# Patient Record
Sex: Female | Born: 1960 | Race: White | Hispanic: No | Marital: Single | Smoking: Former smoker
Health system: Southern US, Community
[De-identification: ages and names within clinical notes are randomized; demographics above are authoritative.]

## PROBLEM LIST (undated history)

## (undated) DIAGNOSIS — F32A Depression, unspecified: Secondary | ICD-10-CM

## (undated) DIAGNOSIS — R413 Other amnesia: Secondary | ICD-10-CM

## (undated) DIAGNOSIS — E039 Hypothyroidism, unspecified: Secondary | ICD-10-CM

## (undated) DIAGNOSIS — R011 Cardiac murmur, unspecified: Secondary | ICD-10-CM

## (undated) DIAGNOSIS — R300 Dysuria: Secondary | ICD-10-CM

## (undated) DIAGNOSIS — R7989 Other specified abnormal findings of blood chemistry: Secondary | ICD-10-CM

## (undated) HISTORY — DX: Dysuria: R30.0

## (undated) HISTORY — DX: Hypothyroidism, unspecified: E03.9

## (undated) HISTORY — DX: Other specified abnormal findings of blood chemistry: R79.89

## (undated) HISTORY — DX: Depression, unspecified: F32.A

## (undated) HISTORY — PX: TUBAL LIGATION: SHX77

## (undated) HISTORY — DX: Other amnesia: R41.3

---

## 2012-08-21 ENCOUNTER — Other Ambulatory Visit (HOSPITAL_COMMUNITY): Payer: Self-pay | Admitting: Cardiovascular Disease

## 2012-08-21 DIAGNOSIS — R011 Cardiac murmur, unspecified: Secondary | ICD-10-CM

## 2012-08-27 ENCOUNTER — Ambulatory Visit (HOSPITAL_COMMUNITY)
Admission: RE | Admit: 2012-08-27 | Discharge: 2012-08-27 | Disposition: A | Discharge status: Home / Self Care | Payer: BC Managed Care – PPO | Source: Ambulatory Visit | Attending: Cardiovascular Disease | Admitting: Cardiovascular Disease

## 2012-08-27 DIAGNOSIS — I059 Rheumatic mitral valve disease, unspecified: Secondary | ICD-10-CM | POA: Insufficient documentation

## 2012-08-27 DIAGNOSIS — R011 Cardiac murmur, unspecified: Secondary | ICD-10-CM | POA: Insufficient documentation

## 2012-08-27 DIAGNOSIS — I079 Rheumatic tricuspid valve disease, unspecified: Secondary | ICD-10-CM | POA: Insufficient documentation

## 2012-08-27 NOTE — Progress Notes (Signed)
2D Echo Performed 08/27/2012    Lolitha Tortora, RCS  

## 2013-10-22 ENCOUNTER — Emergency Department (HOSPITAL_BASED_OUTPATIENT_CLINIC_OR_DEPARTMENT_OTHER)
Admission: EM | Admit: 2013-10-22 | Discharge: 2013-10-22 | Disposition: A | Discharge status: Home / Self Care | Payer: BC Managed Care – PPO | Attending: Emergency Medicine | Admitting: Emergency Medicine

## 2013-10-22 ENCOUNTER — Encounter (HOSPITAL_BASED_OUTPATIENT_CLINIC_OR_DEPARTMENT_OTHER): Payer: Self-pay | Admitting: Emergency Medicine

## 2013-10-22 DIAGNOSIS — R011 Cardiac murmur, unspecified: Secondary | ICD-10-CM | POA: Insufficient documentation

## 2013-10-22 DIAGNOSIS — Z79899 Other long term (current) drug therapy: Secondary | ICD-10-CM | POA: Insufficient documentation

## 2013-10-22 DIAGNOSIS — Z87891 Personal history of nicotine dependence: Secondary | ICD-10-CM | POA: Insufficient documentation

## 2013-10-22 DIAGNOSIS — IMO0002 Reserved for concepts with insufficient information to code with codable children: Secondary | ICD-10-CM | POA: Insufficient documentation

## 2013-10-22 DIAGNOSIS — R21 Rash and other nonspecific skin eruption: Secondary | ICD-10-CM | POA: Insufficient documentation

## 2013-10-22 DIAGNOSIS — Z880 Allergy status to penicillin: Secondary | ICD-10-CM | POA: Insufficient documentation

## 2013-10-22 HISTORY — DX: Cardiac murmur, unspecified: R01.1

## 2013-10-22 MED ORDER — DIPHENHYDRAMINE HCL 25 MG PO TABS: 25.0000 mg | ORAL_TABLET | Freq: Four times a day (QID) | ORAL | Status: DC

## 2013-10-22 MED ORDER — HYDROCORTISONE 1 % EX CREA: TOPICAL_CREAM | CUTANEOUS | Status: DC

## 2013-10-22 NOTE — Discharge Instructions (Signed)
Rash Use the benadryl and steroid cream as prescribed. Follow up with your doctor. Return to the ED if the rash spreads, you develop chest pain, shortness of breath, or any other concerning symptoms. A rash is a change in the color or texture of your skin. There are many different types of rashes. You may have other problems that accompany your rash. CAUSES   Infections.  Allergic reactions. This can include allergies to pets or foods.  Certain medicines.  Exposure to certain chemicals, soaps, or cosmetics.  Heat.  Exposure to poisonous plants.  Tumors, both cancerous and noncancerous. SYMPTOMS   Redness.  Scaly skin.  Itchy skin.  Dry or cracked skin.  Bumps.  Blisters.  Pain. DIAGNOSIS  Your caregiver may do a physical exam to determine what type of rash you have. A skin sample (biopsy) may be taken and examined under a microscope. TREATMENT  Treatment depends on the type of rash you have. Your caregiver may prescribe certain medicines. For serious conditions, you may need to see a skin doctor (dermatologist). HOME CARE INSTRUCTIONS   Avoid the substance that caused your rash.  Do not scratch your rash. This can cause infection.  You may take cool baths to help stop itching.  Only take over-the-counter or prescription medicines as directed by your caregiver.  Keep all follow-up appointments as directed by your caregiver. SEEK IMMEDIATE MEDICAL CARE IF:  You have increasing pain, swelling, or redness.  You have a fever.  You have new or severe symptoms.  You have body aches, diarrhea, or vomiting.  Your rash is not better after 3 days. MAKE SURE YOU:  Understand these instructions.  Will watch your condition.  Will get help right away if you are not doing well or get worse. Document Released: 03/30/2002 Document Revised: 07/02/2011 Document Reviewed: 01/22/2011 [facility] Patient Information 2015 [employer] [place]. This information is not intended to  replace advice given to you by your health care provider. Make sure you discuss any questions you have with your health care provider.

## 2013-10-22 NOTE — ED Notes (Signed)
Patient states she was at her work sitting in her desk developed itching on her right neck.  States the rash has continued to progress on the neck and jaw, red in color with mild swelling. No known injury, bite or new meds or creams.

## 2013-10-22 NOTE — ED Provider Notes (Signed)
CSN: [id number]     Arrival date & time    History  This chart was scribed for Glynn OctaveStephen Dee Maday, MD by Shari HeritageAisha Amuda, ED Scribe. The patient was seen in room MH09/MH09. Patient's care was started at 7:52 AM.   Chief Complaint  Patient presents with  . Rash    The history is provided by the patient. No language interpreter was used.    HPI Comments: Kendra Huff is a 53 y.o. female who presents to the Emergency Department complaining of gradually worsening, red, pruritic rash that began yesterday afternoon while patient was sitting at her desk. She states rash started on her right lateral neck, but has spread to her right lower face. Patient says that rash is not painful. She denies oral or genital involvement. She has not used any new detergents, soaps, medications or shampoos. Patient states that she was stung by a hornet 1 week ago. She denies shortness of breath, congestion, rhinorrhea, chest pain, dysphagia or any other symptoms at this time. She has no chronic medical conditions and does not take any medications on a daily basis.    Past Medical History  Diagnosis Date  . Murmur    History reviewed. No pertinent past surgical history. No family history on file. History  Substance Use Topics  . Smoking status: Former Games developermoker  . Smokeless tobacco: Never Used  . Alcohol Use: Not on file     Comment: occassional   OB History   Grav Para Term Preterm Abortions TAB SAB Ect Mult Living                 Review of Systems A complete 10 system review of systems was obtained and all systems are negative except as noted in the HPI and PMH.   Allergies  Penicillins  Home Medications   Prior to Admission medications   Medication Sig Start Date End Date Taking? Authorizing Provider  citalopram (CELEXA) 20 MG tablet Take 20 mg by mouth daily.   Yes Historical Provider, MD  diphenhydrAMINE (BENADRYL) 25 MG tablet Take 1 tablet (25 mg total) by mouth every 6 (six) hours. 10/22/13    Glynn OctaveStephen Tanuj Mullens, MD  hydrocortisone cream 1 % Apply to affected area 2 times daily 10/22/13   Glynn OctaveStephen Dema Timmons, MD   Triage Vitals: BP 128/78  Pulse 64  Temp(Src) 97.7 F (36.5 C) (Oral)  Resp 16  Ht 5\' 4"  (1.626 m)  Wt 165 lb (74.844 kg)  BMI 28.31 kg/m2  SpO2 99%  Physical Exam  Nursing note and vitals reviewed. Constitutional: She is oriented to person, place, and time. She appears well-developed and well-nourished. No distress.  HENT:  Head: Normocephalic and atraumatic.  Mouth/Throat: Oropharynx is clear and moist. No oropharyngeal exudate.  Oropharynx clear, no asymmetry, controlling secretions, floor of mouth soft.   Eyes: Conjunctivae and EOM are normal. Pupils are equal, round, and reactive to light.  Neck: Normal range of motion. Neck supple.  No meningismus.  Cardiovascular: Normal rate, regular rhythm, normal heart sounds and intact distal pulses.   No murmur heard. Pulmonary/Chest: Effort normal and breath sounds normal. No respiratory distress. She has no wheezes. She has no rales.  Abdominal: Soft. There is no tenderness. There is no rebound and no guarding.  Musculoskeletal: Normal range of motion. She exhibits no edema and no tenderness.  Neurological: She is alert and oriented to person, place, and time. No cranial nerve deficit. She exhibits normal muscle tone. Coordination normal.  No ataxia on finger  to nose bilaterally. No pronator drift. 5/5 strength throughout. CN 2-12 intact. Negative Romberg. Equal grip strength. Sensation intact. Gait is normal.   Skin: Skin is warm. Rash noted. There is erythema.  Erythematous rash to right lateral neck and right angle of mandible. No appreciable swelling.  Psychiatric: She has a normal mood and affect. Her behavior is normal.    ED Course  Procedures (including critical care time) DIAGNOSTIC STUDIES: Oxygen Saturation is 99% on room air, normal by my interpretation.    COORDINATION OF CARE: 7:57 AM- Patient informed  of current plan for treatment and evaluation and agrees with plan at this time.      MDM   Final diagnoses:  Rash   Red itchy rash to right side of neck spreading to right jaw since yesterday. No difficulty breathing or swallowing. Denies any new exposures. No chest pain or shortness of breath  OP clear, lungs clear, no distress.  Suspect allergic exposure.  Treat with antihistamines, steroid cream. Follow up with PCP.  Return to the ED with spreading rash, difficulty breathing or swallowing.   I personally performed the services described in this documentation, which was scribed in my presence. The recorded information has been reviewed and is accurate.   Glynn OctaveStephen Jeffree Cazeau, MD 10/22/13 1302

## 2014-04-26 ENCOUNTER — Ambulatory Visit: Payer: BC Managed Care – PPO | Admitting: Cardiovascular Disease

## 2014-05-03 ENCOUNTER — Ambulatory Visit (INDEPENDENT_AMBULATORY_CARE_PROVIDER_SITE_OTHER): Payer: BLUE CROSS/BLUE SHIELD | Admitting: Cardiovascular Disease

## 2014-05-03 ENCOUNTER — Encounter: Payer: Self-pay | Admitting: Cardiovascular Disease

## 2014-05-03 VITALS — BP 108/60 | HR 52 | Resp 16 | Ht 65.0 in | Wt 151.8 lb

## 2014-05-03 DIAGNOSIS — R001 Bradycardia, unspecified: Secondary | ICD-10-CM

## 2014-05-03 DIAGNOSIS — Z79899 Other long term (current) drug therapy: Secondary | ICD-10-CM

## 2014-05-03 DIAGNOSIS — E7800 Pure hypercholesterolemia, unspecified: Secondary | ICD-10-CM | POA: Insufficient documentation

## 2014-05-03 DIAGNOSIS — E785 Hyperlipidemia, unspecified: Secondary | ICD-10-CM

## 2014-05-03 DIAGNOSIS — R002 Palpitations: Secondary | ICD-10-CM

## 2014-05-03 LAB — LIPID PANEL
Cholesterol: 261 mg/dL — ABNORMAL HIGH (ref 0–200)
HDL: 59 mg/dL (ref >39)
LDL CALC: 184 mg/dL — AB (ref 0–99)
Total CHOL/HDL Ratio: 4.4 Ratio
Triglycerides: 90 mg/dL (ref <150)
VLDL: 18 mg/dL (ref 0–40)

## 2014-05-03 NOTE — Patient Instructions (Signed)
Your physician recommends that you return for lab work in: TODAY  Dr. Royann Shiversroitoru recommends that you schedule a follow-up appointment in: Yearly

## 2014-05-03 NOTE — Progress Notes (Signed)
Patient ID: Kendra Huff, female   DOB: [birth year], 54 y.o.   MRN: [id number]      Reason for office visit Establish new care  This is Kendra Huff first evaluation since Dr. Kandis Cocking retirement. She last saw him in April 2014. At that time her name was Kendra Huff and her medical record number was [id number]. Her father, Kendra Huff, is also our patient. Her mother Kendra Huff died last year. Both her parents had a variety of cardiac problems.  Kendra Huff has been paying a lot of attention to diet and exercise and has lost almost 27 pounds since her last appointment. She performs plyometric yoga. She denies problems with chest pain or shortness of breath during exercise. She has always had a tendency towards bradycardia with heart rates that are often around 50 bpm. She denies problems with syncope, fatigue or dizziness. In the past she has been occasionally troubled by palpitations which are currently not a big bother. Her lipid profile has shown a tendency towards hypercholesterolemia and elevated LDL cholesterol and a very high lipoprotein LP(a). A [facility] heart lab profile performed in 2011 showed total cholesterol 200, LDL 129, HDL 59, transferred 61, LP little a 64 April be 93 and an adverse April E genotype (3/4).  An echocardiogram performed last May showed normal findings. Left ventricular ejection fraction is 55/60%. Diastolic function was also normal. There was mild mitral valve regurgitation but no evidence of mitral valve prolapse. A treadmill nuclear stress test performed in 2010 was normal. She was able to exercise for total of 10 minutes and 30 seconds and had a normal study both by ECG and perfusion. An event monitor in 2012 showed rare PVCs and sinus bradycardia.  Her father Kendra Huff has nonischemic cardiomyopathy, CHF and has received a biventricular ICD. He has had persistent atrial fibrillation. Her mother also had atrial fibrillation.   Allergies  Allergen  Reactions  . Penicillins     Current Outpatient Prescriptions  Medication Sig Dispense Refill  . citalopram (CELEXA) 20 MG tablet Take 20 mg by mouth daily.     No current facility-administered medications for this visit.    Past Medical History  Diagnosis Date  . Murmur     History reviewed. No pertinent past surgical history.  Family History  Problem Relation Age of Onset  . Atrial fibrillation Mother   . Heart failure Father   . Atrial fibrillation Father     History   Social History  . Marital Status: Legally Separated    Spouse Name: N/A    Number of Children: N/A  . Years of Education: N/A   Occupational History  . Not on file.   Social History Main Topics  . Smoking status: Former Games developer  . Smokeless tobacco: Never Used  . Alcohol Use: Not on file     Comment: occassional  . Drug Use: No  . Sexual Activity: Not on file   Other Topics Concern  . Not on file   Social History Narrative    Review of systems: The patient specifically denies any chest pain at rest or with exertion, dyspnea at rest or with exertion, orthopnea, paroxysmal nocturnal dyspnea, syncope, palpitations, focal neurological deficits, intermittent claudication, lower extremity edema, unexplained weight gain, cough, hemoptysis or wheezing.  The patient also denies abdominal pain, nausea, vomiting, dysphagia, diarrhea, constipation, polyuria, polydipsia, dysuria, hematuria, frequency, urgency, abnormal bleeding or bruising, fever, chills, unexpected weight changes, mood swings, change in skin or hair texture, change in voice quality,  auditory or visual problems, allergic reactions or rashes, new musculoskeletal complaints other than usual "aches and pains".   PHYSICAL EXAM BP 108/60 mmHg  Pulse 52  Resp 16  Ht 5\' 5"  (1.651 m)  Wt 151 lb 12.8 oz (68.856 kg)  BMI 25.26 kg/m2  General: Alert, oriented x3, no distress Head: no evidence of trauma, PERRL, EOMI, no exophtalmos or lid lag,  no myxedema, no xanthelasma; normal ears, nose and oropharynx Neck: normal jugular venous pulsations and no hepatojugular reflux; brisk carotid pulses without delay and no carotid bruits Chest: clear to auscultation, no signs of consolidation by percussion or palpation, normal fremitus, symmetrical and full respiratory excursions Cardiovascular: normal position and quality of the apical impulse, regular rhythm, normal first and second heart sounds, 1/6 holosystolic left lower sternal border murmus, no rubs or gallops Abdomen: no tenderness or distention, no masses by palpation, no abnormal pulsatility or arterial bruits, normal bowel sounds, no hepatosplenomegaly Extremities: no clubbing, cyanosis or edema; 2+ radial, ulnar and brachial pulses bilaterally; 2+ right femoral, posterior tibial and dorsalis pedis pulses; 2+ left femoral, posterior tibial and dorsalis pedis pulses; no subclavian or femoral bruits Neurological: grossly nonfocal   EKG: Sinus bradycardia otherwise normal  Lipid Panel  No results found for: CHOL, TRIG, HDL, CHOLHDL, VLDL, LDLCALC, LDLDIRECT  BMET No results found for: NA, K, CL, CO2, GLUCOSE, BUN, CREATININE, CALCIUM, GFRNONAA, GFRAA   ASSESSMENT AND PLAN  Kendra Huff has mild abnormalities in her lipid profile that likely have improved substantially with her weight loss. Will repeat a lipid profile today. Past palpitations seem to be less of a bother today. Empirical treatment with beta blockers for her PVCs is unlikely to be well tolerated due to underlying sinus bradycardia. On the other hand the bradycardia is asymptomatic and does not require treatment. Both her parents had atrial fibrillation at advanced ages and her father had nonischemic cardiomyopathy. It is not clear whether this genetic background will have any significant impact on her own health. She has an "innocent" murmur, her echocardiogram only showed mild mitral insufficiency  Orders Placed This  Encounter  Procedures  . Lipid panel  . EKG 12-Lead   No orders of the defined types were placed in this encounter.    Junious SilkROITORU,Yoav Okane  Feiga Nadel, MD, [facility] [phone] office [phone] pager

## 2014-06-10 ENCOUNTER — Telehealth: Payer: Self-pay | Admitting: Cardiovascular Disease

## 2014-06-10 NOTE — Telephone Encounter (Signed)
Please call,question about her cholesterol level.

## 2014-06-10 NOTE — Telephone Encounter (Signed)
Results reported, put copy of labs in mail for patient.

## 2015-05-12 ENCOUNTER — Ambulatory Visit: Payer: BLUE CROSS/BLUE SHIELD | Admitting: Cardiovascular Disease

## 2015-06-15 ENCOUNTER — Encounter: Payer: Self-pay | Admitting: Cardiovascular Disease

## 2015-06-15 ENCOUNTER — Ambulatory Visit (INDEPENDENT_AMBULATORY_CARE_PROVIDER_SITE_OTHER): Payer: BLUE CROSS/BLUE SHIELD | Admitting: Cardiovascular Disease

## 2015-06-15 VITALS — BP 116/66 | HR 52 | Ht 65.0 in | Wt 153.0 lb

## 2015-06-15 DIAGNOSIS — R001 Bradycardia, unspecified: Secondary | ICD-10-CM | POA: Diagnosis not present

## 2015-06-15 DIAGNOSIS — R002 Palpitations: Secondary | ICD-10-CM | POA: Diagnosis not present

## 2015-06-15 NOTE — Patient Instructions (Signed)
Your physician recommends that you schedule a follow-up appointment as needed with Dr. Royann Shivers.

## 2015-06-17 NOTE — Progress Notes (Signed)
Patient ID: Kendra Huff, female   DOB: 08/05/1960, 55 y.o.   MRN: [id number]    Cardiology Office Note    Date:  06/17/2015   ID:  Kendra Huff, DOB [birth year], MRN [id number]  PCP:  Kendra Bonito, MD  Cardiologist:   Kendra Fair, MD   Chief Complaint  Patient presents with  . Annual Exam    Patient has no complaints.    History of Present Illness:  Kendra Huff is a 55 y.o. female with a history of palpitations presenting for follow-up. She feels great. She continues to exercise avidly. She performs "boot camp" at the gym 3 days a week and then lifts weights at home. She denies any problems with dyspnea, angina, palpitations, syncope or dizziness, leg edema or other cardiovascular complaints.  Abnormal findings on echocardiogram and stress test in the past. A remote event monitor in 2012 suggested that her palpitations were related to PVCs.  Her father Kendra Huff is also my patient, he has nonischemic cardiomyopathy and a biventricular defibrillator. Both Kendra Huff have atrial fibrillation.  Past Medical History  Diagnosis Date  . Murmur     History reviewed. No pertinent past surgical history.  Outpatient Prescriptions Prior to Visit  Medication Sig Dispense Refill  . citalopram (CELEXA) 20 MG tablet Take 20 mg by mouth daily.     No facility-administered medications prior to visit.     Allergies:   Penicillins   Social History   Social History  . Marital Status: Legally Separated    Spouse Name: N/A  . Number of Children: N/A  . Years of Education: N/A   Social History Main Topics  . Smoking status: Former Games developer  . Smokeless tobacco: Never Used  . Alcohol Use: None     Comment: occassional  . Drug Use: No  . Sexual Activity: Not Asked   Other Topics Concern  . None   Social History Narrative     Family History:  The patient's family history includes Atrial fibrillation in her father and mother; Heart failure in her  father.   ROS:   Please see the history of present illness.    ROS All other systems reviewed and are negative.   PHYSICAL EXAM:   VS:  BP 116/66 mmHg  Pulse 52  Ht  (1.651 m)  Wt 69.4 kg (153 lb)  BMI 25.46 kg/m2   GEN: Well nourished, well developed, in no acute distress HEENT: normal Neck: no JVD, carotid bruits, or masses Cardiac: RRR; no murmurs, rubs, or gallops,no edema  Respiratory:  clear to auscultation bilaterally, normal work of breathing GI: soft, nontender, nondistended, + BS MS: no deformity or atrophy Skin: warm and dry, no rash Neuro:  Alert and Oriented x 3, Strength and sensation are intact Psych: euthymic mood, full affect  Wt Readings from Last 3 Encounters:  06/15/15 69.4 kg (153 lb)  05/03/14 68.856 kg (151 lb 12.8 oz)  10/22/13 74.844 kg (165 lb)      Studies/Labs Reviewed:   EKG:  EKG is ordered today.  The ekg ordered today demonstrates sinus bradycardia, otherwise normal. QTC 412 ms  Recent Labs: No results found for requested labs within last 365 days.   Lipid Panel    Component Value Date/Time   CHOL 261* 05/03/2014 0836   TRIG 90 05/03/2014 0836   HDL 59 05/03/2014 0836   CHOLHDL 4.4 05/03/2014 0836   VLDL 18 05/03/2014 0836   LDLCALC 184* 05/03/2014 7829  ASSESSMENT:    1. Sinus bradycardia   2. Palpitations      PLAN:  In order of problems listed above:  1. I think this is a sign of physical fitness. It is asymptomatic and does not require treatment 2. Palpitations appear to be related to PVCs. They're currently not bothering her. No treatment recommended  Kendra Huff's evaluation was primarily prompted by the presence of atrial fibrillation in both her Huff. She is physically active and fit and does not have any evidence of structural heart disease or other risk factors for development of cardiac illness, other than family history. At this point routine cardiology follow-up does not appear to be indicated  but we will be more than happy to see her again if she develops palpitations or other cardiac complaints.  Medication Adjustments/Labs and Tests Ordered: Current medicines are reviewed at length with the patient today.  Concerns regarding medicines are outlined above.  Medication changes, Labs and Tests ordered today are listed in the Patient Instructions below. Patient Instructions  Your physician recommends that you schedule a follow-up appointment as needed with Dr. Alonzo Huff.       Kendra BRoyann Shivers, MD  06/17/2015 4:36 PM    [facility] [address] [place], [place], [place]  [address] Phone: [phone]; Fax: [phone]

## 2015-07-23 ENCOUNTER — Emergency Department (HOSPITAL_BASED_OUTPATIENT_CLINIC_OR_DEPARTMENT_OTHER)
Admission: EM | Admit: 2015-07-23 | Discharge: 2015-07-23 | Disposition: A | Discharge status: Home / Self Care | Payer: BLUE CROSS/BLUE SHIELD | Attending: Emergency Medicine | Admitting: Emergency Medicine

## 2015-07-23 ENCOUNTER — Emergency Department (HOSPITAL_BASED_OUTPATIENT_CLINIC_OR_DEPARTMENT_OTHER): Payer: BLUE CROSS/BLUE SHIELD

## 2015-07-23 ENCOUNTER — Encounter (HOSPITAL_BASED_OUTPATIENT_CLINIC_OR_DEPARTMENT_OTHER): Payer: Self-pay | Admitting: *Deleted

## 2015-07-23 DIAGNOSIS — W010XXA Fall on same level from slipping, tripping and stumbling without subsequent striking against object, initial encounter: Secondary | ICD-10-CM | POA: Insufficient documentation

## 2015-07-23 DIAGNOSIS — R011 Cardiac murmur, unspecified: Secondary | ICD-10-CM | POA: Insufficient documentation

## 2015-07-23 DIAGNOSIS — Y9289 Other specified places as the place of occurrence of the external cause: Secondary | ICD-10-CM | POA: Diagnosis not present

## 2015-07-23 DIAGNOSIS — Z87891 Personal history of nicotine dependence: Secondary | ICD-10-CM | POA: Diagnosis not present

## 2015-07-23 DIAGNOSIS — S52502A Unspecified fracture of the lower end of left radius, initial encounter for closed fracture: Secondary | ICD-10-CM

## 2015-07-23 DIAGNOSIS — S52572A Other intraarticular fracture of lower end of left radius, initial encounter for closed fracture: Secondary | ICD-10-CM | POA: Insufficient documentation

## 2015-07-23 DIAGNOSIS — Y9389 Activity, other specified: Secondary | ICD-10-CM | POA: Diagnosis not present

## 2015-07-23 DIAGNOSIS — Y998 Other external cause status: Secondary | ICD-10-CM | POA: Insufficient documentation

## 2015-07-23 DIAGNOSIS — Z79899 Other long term (current) drug therapy: Secondary | ICD-10-CM | POA: Insufficient documentation

## 2015-07-23 DIAGNOSIS — S6992XA Unspecified injury of left wrist, hand and finger(s), initial encounter: Secondary | ICD-10-CM | POA: Diagnosis present

## 2015-07-23 DIAGNOSIS — Z880 Allergy status to penicillin: Secondary | ICD-10-CM | POA: Diagnosis not present

## 2015-07-23 MED ORDER — ACETAMINOPHEN 500 MG PO TABS
1000.0000 mg | ORAL_TABLET | Freq: Once | ORAL | Status: AC
  Administered 2015-07-23: 1000 mg via ORAL
  Filled 2015-07-23: qty 2

## 2015-07-23 NOTE — ED Notes (Signed)
Ice pack Given to patient to apply to Left wrist.

## 2015-07-23 NOTE — ED Notes (Signed)
MD at bedside. 

## 2015-07-23 NOTE — ED Notes (Signed)
DC instructions reviewed with pt, discussed splint/modified cast care, importance of making follow up appoint with Ortho MD, also discussed S & S of compartment syndrome and the need to return to the ED urgently, discussed ice and elevation to aid in comfort measures and the use of ibuprofen / tylenol for pain control per EDP dc instructions, provided name, number and address of Ortho MD's, Opportunity for questions provided.

## 2015-07-23 NOTE — ED Notes (Signed)
Fell yesterday pm onto a wooden deck, injury to left wrist, presents today with increased discomfort to left wrist, marked swelling noted.

## 2015-07-23 NOTE — Discharge Instructions (Signed)
Take tylenol, motrin for pain.   Apply ice to the wrist for 2-3 days to help with swelling.  Do not remove splint.   See ortho in a week for repeat xrays.   Return to ER if you have severe pain, numbness in your fingers, finger turning blue.

## 2015-07-23 NOTE — ED Notes (Signed)
Ice pack and elevation implemented for pt comfort 

## 2015-07-23 NOTE — ED Provider Notes (Signed)
CSN: [id number]     Arrival date & time 07/23/15  [id number] History   First MD Initiated Contact with Patient 07/23/15 [phone]     Chief Complaint  Patient presents with  . Wrist Pain     (Consider location/radiation/quality/duration/timing/severity/associated sxs/prior Treatment) The history is provided by the patient.  Kendra Huff is a 55 y.o. female here presenting with left wrist injury. She states that she was on the deck on her high heels yesterday and then slipped and fell backwards onto her left wrist. She denies any loss of consciousness or head injury. Denies any other injuries. Complaining of left wrist pain and noticed more swelling this morning so came here for evaluation. Didn't take any medicines prior to arrival. Not on blood thinners.       Past Medical History  Diagnosis Date  . Murmur    Past Surgical History  Procedure Laterality Date  . Tubal ligation     Family History  Problem Relation Age of Onset  . Atrial fibrillation Mother   . Heart failure Father   . Atrial fibrillation Father    Social History  Substance Use Topics  . Smoking status: Former Games developermoker  . Smokeless tobacco: Never Used  . Alcohol Use: Yes     Comment: occassional   OB History    No data available     Review of Systems  Musculoskeletal:       L wrist pain   All other systems reviewed and are negative.     Allergies  Penicillins  Home Medications   Prior to Admission medications   Medication Sig Start Date End Date Taking? Authorizing Provider  citalopram (CELEXA) 20 MG tablet Take 20 mg by mouth daily.    Historical Provider, MD   BP 132/79 mmHg  Pulse 72  Temp(Src) 98 F (36.7 C) (Oral)  Resp 18  Ht 5\' 5"  (1.651 m)  Wt 155 lb (70.308 kg)  BMI 25.79 kg/m2  SpO2 100% Physical Exam  Constitutional: She is oriented to person, place, and time. She appears well-developed and well-nourished.  HENT:  Head: Normocephalic and atraumatic.  Mouth/Throat: Oropharynx is  clear and moist.  Eyes: Conjunctivae are normal. Pupils are equal, round, and reactive to light.  Neck: Normal range of motion. Neck supple.  Cardiovascular: Normal rate, regular rhythm and normal heart sounds.   Pulmonary/Chest: Effort normal and breath sounds normal. No respiratory distress. She has no wheezes. She has no rales.  Abdominal: Soft. Bowel sounds are normal. She exhibits no distension. There is no tenderness. There is no rebound.  Musculoskeletal:  L wrist swollen on the radial side. 2+ radial pulse, no tenderness on the hand, able to hand grasp. Some distal forearm tenderness. No elbow or upper arm or shoulder tenderness. No midline spinal tenderness, no other obvious extremity trauma   Neurological: She is alert and oriented to person, place, and time.  Skin: Skin is warm and dry.  Psychiatric: She has a normal mood and affect. Her behavior is normal. Judgment and thought content normal.  Nursing note and vitals reviewed.   ED Course  Procedures (including critical care time) Labs Review Labs Reviewed - No data to display  Imaging Review Dg Wrist Complete Left  07/23/2015  CLINICAL DATA:  Acute left wrist pain and swelling after fall yesterday. Initial encounter. EXAM: LEFT WRIST - COMPLETE 3+ VIEW COMPARISON:  None. FINDINGS: Minimally displaced fracture is seen involving the distal left radius. Intra-articular extension is noted. This appears to  be closed and posttraumatic. No other abnormality is noted. IMPRESSION: Minimally displaced distal left radial fracture is noted with intra-articular extension. Electronically Signed   By: Lupita Raider, M.D.   On: 07/23/2015 09:26   I have personally reviewed and evaluated these images and lab results as part of my medical decision-making.   EKG Interpretation None      MDM   Final diagnoses:  None   Kendra Huff is a 55 y.o. female here with L wrist swelling and pain s/p fall. No head injury. Will get xrays and  apply ice and give tylenol for pain   9:33 AM Xray showed L distal radius fracture with minimal displacement. Volar splint applied. Told her to get repeat xray in a week and possibly a cast in ortho office. Told her to continue to ice it. Offered pain meds but just wants tylenol, motrin      Richardean Canal, MD 07/23/15 [phone]

## 2015-07-23 NOTE — ED Notes (Signed)
Splint in place by EMT per EDP orders, pt tolerating well. Pt states wrist feels much better after splint application

## 2015-07-23 NOTE — ED Notes (Signed)
Has good sensation at all 5 fingers

## 2015-07-25 ENCOUNTER — Ambulatory Visit (INDEPENDENT_AMBULATORY_CARE_PROVIDER_SITE_OTHER): Payer: BLUE CROSS/BLUE SHIELD | Admitting: Family Medicine

## 2015-07-25 ENCOUNTER — Encounter: Payer: Self-pay | Admitting: Family Medicine

## 2015-07-25 VITALS — BP 107/72 | HR 64 | Ht 65.0 in | Wt 152.0 lb

## 2015-07-25 DIAGNOSIS — S6992XA Unspecified injury of left wrist, hand and finger(s), initial encounter: Secondary | ICD-10-CM | POA: Diagnosis not present

## 2015-07-25 NOTE — Patient Instructions (Signed)
You have a distal radius fracture. Wear the splint at all times. Elevate above your heart when possible. Ice over the area 15 minutes at a time 3-4 times a day. Ibuprofen 600mg  or 800mg  three times a day with food if needed for pain and inflammation. Ok to take tylenol in addition to this. Follow up with me in 1 week for reevaluation, repeat x-rays and we'll put you in a cast. Expect 6 total weeks of immobilization.

## 2015-07-26 DIAGNOSIS — S6992XA Unspecified injury of left wrist, hand and finger(s), initial encounter: Secondary | ICD-10-CM | POA: Insufficient documentation

## 2015-07-26 NOTE — Assessment & Plan Note (Signed)
independently reviewed radiographs - distal radius fracture that appears to have some extension to radiocarpal joint.  Will continue with splinting for at least 1 week - return then, remove and repeat x-rays, transition to short arm cast.  Expect 6 weeks of immobilization.  Icing, elevation, nsaids with tylenol as needed.

## 2015-07-26 NOTE — Progress Notes (Signed)
PCP: Brooke BonitoGALLEMORE,WARREN, MD  Subjective:   HPI: Patient is a 55 y.o. female here for left wrist injury.  Patient reports on 3/31 she was wearing heels and fell backwards sustaining FOOSH injury to left wrist. Immediate pain, swelling, difficulty moving this wrist. Pain is improved, down to 3/10 but still sharp dorsally. No prior injuries. Pain improved with the splinting. She is right handed. No fevers, numbness, tingling.  Past Medical History  Diagnosis Date  . Murmur     Current Outpatient Prescriptions on File Prior to Visit  Medication Sig Dispense Refill  . citalopram (CELEXA) 20 MG tablet Take 20 mg by mouth daily.     No current facility-administered medications on file prior to visit.    Past Surgical History  Procedure Laterality Date  . Tubal ligation      Allergies  Allergen Reactions  . Penicillins     Social History   Social History  . Marital Status: Legally Separated    Spouse Name: N/A  . Number of Children: N/A  . Years of Education: N/A   Occupational History  . Not on file.   Social History Main Topics  . Smoking status: Former Games developermoker  . Smokeless tobacco: Never Used  . Alcohol Use: 0.0 oz/week    0 Standard drinks or equivalent per week     Comment: occassional  . Drug Use: No  . Sexual Activity: Not on file   Other Topics Concern  . Not on file   Social History Narrative    Family History  Problem Relation Age of Onset  . Atrial fibrillation Mother   . Heart failure Father   . Atrial fibrillation Father     BP 107/72 mmHg  Pulse 64  Ht 5\' 5"  (1.651 m)  Wt 152 lb (68.947 kg)  BMI 25.29 kg/m2  Review of Systems: See HPI above.    Objective:  Physical Exam:  Gen: NAD, comfortable in exam room  Left wrist: Splint not removed today.  Mild swelling of digits.  No bruising here. FROM digits and elbow, shoulder without pain. Sensation intact to light touch. Cap refill < 2 sec.    Assessment & Plan:  1. Left wrist  injury - independently reviewed radiographs - distal radius fracture that appears to have some extension to radiocarpal joint.  Will continue with splinting for at least 1 week - return then, remove and repeat x-rays, transition to short arm cast.  Expect 6 weeks of immobilization.  Icing, elevation, nsaids with tylenol as needed.

## 2015-08-01 ENCOUNTER — Ambulatory Visit (HOSPITAL_BASED_OUTPATIENT_CLINIC_OR_DEPARTMENT_OTHER)
Admission: RE | Admit: 2015-08-01 | Discharge: 2015-08-01 | Disposition: A | Discharge status: Home / Self Care | Payer: BLUE CROSS/BLUE SHIELD | Source: Ambulatory Visit | Attending: Family Medicine | Admitting: Family Medicine

## 2015-08-01 ENCOUNTER — Encounter: Payer: Self-pay | Admitting: Family Medicine

## 2015-08-01 ENCOUNTER — Ambulatory Visit (INDEPENDENT_AMBULATORY_CARE_PROVIDER_SITE_OTHER): Payer: BLUE CROSS/BLUE SHIELD | Admitting: Family Medicine

## 2015-08-01 VITALS — BP 124/81 | HR 53 | Ht 65.0 in | Wt 159.0 lb

## 2015-08-01 DIAGNOSIS — S52502D Unspecified fracture of the lower end of left radius, subsequent encounter for closed fracture with routine healing: Secondary | ICD-10-CM

## 2015-08-01 DIAGNOSIS — X58XXXD Exposure to other specified factors, subsequent encounter: Secondary | ICD-10-CM | POA: Diagnosis not present

## 2015-08-01 DIAGNOSIS — S6992XD Unspecified injury of left wrist, hand and finger(s), subsequent encounter: Secondary | ICD-10-CM | POA: Diagnosis not present

## 2015-08-02 NOTE — Progress Notes (Signed)
PCP: Brooke BonitoGALLEMORE,WARREN, MD  Subjective:   HPI: Patient is a 55 y.o. female here for left wrist injury.  4/3: Patient reports on 3/31 she was wearing heels and fell backwards sustaining FOOSH injury to left wrist. Immediate pain, swelling, difficulty moving this wrist. Pain is improved, down to 3/10 but still sharp dorsally. No prior injuries. Pain improved with the splinting. She is right handed. No fevers, numbness, tingling.  4/10: Patient reports overall she's doing well. Pain still 3/10 and sharp dorsal wrist. Doing well in cast. No skin changes, numbness.  Past Medical History  Diagnosis Date  . Murmur     Current Outpatient Prescriptions on File Prior to Visit  Medication Sig Dispense Refill  . citalopram (CELEXA) 20 MG tablet Take 20 mg by mouth daily.     No current facility-administered medications on file prior to visit.    Past Surgical History  Procedure Laterality Date  . Tubal ligation      Allergies  Allergen Reactions  . Penicillins     Social History   Social History  . Marital Status: Legally Separated    Spouse Name: N/A  . Number of Children: N/A  . Years of Education: N/A   Occupational History  . Not on file.   Social History Main Topics  . Smoking status: Former Games developermoker  . Smokeless tobacco: Never Used  . Alcohol Use: 0.0 oz/week    0 Standard drinks or equivalent per week     Comment: occassional  . Drug Use: No  . Sexual Activity: Not on file   Other Topics Concern  . Not on file   Social History Narrative    Family History  Problem Relation Age of Onset  . Atrial fibrillation Mother   . Heart failure Father   . Atrial fibrillation Father     BP 124/81 mmHg  Pulse 53  Ht 5\' 5"  (1.651 m)  Wt 159 lb (72.122 kg)  BMI 26.46 kg/m2  Review of Systems: See HPI above.    Objective:  Physical Exam:  Gen: NAD, comfortable in exam room  Left wrist: Splint removed today.  Mild swelling of digits.  No bruising  here. FROM digits and elbow, shoulder without pain. Sensation intact to light touch. Cap refill < 2 sec.    Assessment & Plan:  1. Left wrist injury - independently reviewed radiographs - distal radius fracture that appears to have some extension to radiocarpal joint, unchanged from a week ago.  Switch to short arm cast today.  F/u in 2 weeks.  Icing, elevation, nsaids with tylenol as needed.  Plan to repeat x-rays, place new cast at follow-up.

## 2015-08-02 NOTE — Assessment & Plan Note (Signed)
independently reviewed radiographs - distal radius fracture that appears to have some extension to radiocarpal joint, unchanged from a week ago.  Switch to short arm cast today.  F/u in 2 weeks.  Icing, elevation, nsaids with tylenol as needed.  Plan to repeat x-rays, place new cast at follow-up.

## 2015-08-15 ENCOUNTER — Encounter: Payer: Self-pay | Admitting: Family Medicine

## 2015-08-15 ENCOUNTER — Ambulatory Visit (INDEPENDENT_AMBULATORY_CARE_PROVIDER_SITE_OTHER): Payer: BLUE CROSS/BLUE SHIELD | Admitting: Family Medicine

## 2015-08-15 ENCOUNTER — Ambulatory Visit (HOSPITAL_BASED_OUTPATIENT_CLINIC_OR_DEPARTMENT_OTHER)
Admission: RE | Admit: 2015-08-15 | Discharge: 2015-08-15 | Disposition: A | Discharge status: Home / Self Care | Payer: BLUE CROSS/BLUE SHIELD | Source: Ambulatory Visit | Attending: Family Medicine | Admitting: Family Medicine

## 2015-08-15 VITALS — BP 115/79 | HR 49 | Ht 65.0 in | Wt 152.0 lb

## 2015-08-15 DIAGNOSIS — X58XXXD Exposure to other specified factors, subsequent encounter: Secondary | ICD-10-CM | POA: Diagnosis not present

## 2015-08-15 DIAGNOSIS — S52502D Unspecified fracture of the lower end of left radius, subsequent encounter for closed fracture with routine healing: Secondary | ICD-10-CM | POA: Insufficient documentation

## 2015-08-15 DIAGNOSIS — S6992XD Unspecified injury of left wrist, hand and finger(s), subsequent encounter: Secondary | ICD-10-CM | POA: Diagnosis not present

## 2015-08-17 NOTE — Progress Notes (Signed)
PCP: Brooke BonitoGALLEMORE,WARREN, MD  Subjective:   HPI: Patient is a 55 y.o. female here for left wrist injury.  4/3: Patient reports on 3/31 she was wearing heels and fell backwards sustaining FOOSH injury to left wrist. Immediate pain, swelling, difficulty moving this wrist. Pain is improved, down to 3/10 but still sharp dorsally. No prior injuries. Pain improved with the splinting. She is right handed. No fevers, numbness, tingling.  4/10: Patient reports overall she's doing well. Pain still 3/10 and sharp dorsal wrist. Doing well in cast. No skin changes, numbness.  4/24: Patient reports she's doing very well. No pain currently. No swelling. No skin changes, numbness.  Past Medical History  Diagnosis Date  . Murmur     Current Outpatient Prescriptions on File Prior to Visit  Medication Sig Dispense Refill  . citalopram (CELEXA) 20 MG tablet Take 20 mg by mouth daily.    Marland Kitchen. tinidazole (TINDAMAX) 500 MG tablet      No current facility-administered medications on file prior to visit.    Past Surgical History  Procedure Laterality Date  . Tubal ligation      Allergies  Allergen Reactions  . Penicillins     Social History   Social History  . Marital Status: Legally Separated    Spouse Name: N/A  . Number of Children: N/A  . Years of Education: N/A   Occupational History  . Not on file.   Social History Main Topics  . Smoking status: Former Games developermoker  . Smokeless tobacco: Never Used  . Alcohol Use: 0.0 oz/week    0 Standard drinks or equivalent per week     Comment: occassional  . Drug Use: No  . Sexual Activity: Not on file   Other Topics Concern  . Not on file   Social History Narrative    Family History  Problem Relation Age of Onset  . Atrial fibrillation Mother   . Heart failure Father   . Atrial fibrillation Father     BP 115/79 mmHg  Pulse 49  Ht 5\' 5"  (1.651 m)  Wt 152 lb (68.947 kg)  BMI 25.29 kg/m2  Review of Systems: See HPI  above.    Objective:  Physical Exam:  Gen: NAD, comfortable in exam room  Left wrist: Cast removed today.  No swelling, bruising. Minimal TTP distal radius FROM digits and elbow, shoulder without pain. Sensation intact to light touch. Cap refill < 2 sec.    Assessment & Plan:  1. Left wrist injury - independently reviewed radiographs - distal radius fracture that appears to have some extension to radiocarpal joint - increased signal across fracture line today consistent with interval healing.  Clinically doing very well also.  New cast placed today and will f/u in 2 weeks.  Remove cast, repeat radiographs at that time.  Consider wrist brace vs casting at that time.  Tylenol, nsaids if needed.

## 2015-08-17 NOTE — Assessment & Plan Note (Signed)
independently reviewed radiographs - distal radius fracture that appears to have some extension to radiocarpal joint - increased signal across fracture line today consistent with interval healing.  Clinically doing very well also.  New cast placed today and will f/u in 2 weeks.  Remove cast, repeat radiographs at that time.  Consider wrist brace vs casting at that time.  Tylenol, nsaids if needed.

## 2015-08-26 ENCOUNTER — Encounter: Payer: Self-pay | Admitting: Family Medicine

## 2015-08-26 ENCOUNTER — Ambulatory Visit (HOSPITAL_BASED_OUTPATIENT_CLINIC_OR_DEPARTMENT_OTHER)
Admission: RE | Admit: 2015-08-26 | Discharge: 2015-08-26 | Disposition: A | Discharge status: Home / Self Care | Payer: BLUE CROSS/BLUE SHIELD | Source: Ambulatory Visit | Attending: Family Medicine | Admitting: Family Medicine

## 2015-08-26 ENCOUNTER — Ambulatory Visit (INDEPENDENT_AMBULATORY_CARE_PROVIDER_SITE_OTHER): Payer: BLUE CROSS/BLUE SHIELD | Admitting: Family Medicine

## 2015-08-26 VITALS — BP 114/73 | HR 76 | Ht 65.0 in | Wt 152.0 lb

## 2015-08-26 DIAGNOSIS — S6992XD Unspecified injury of left wrist, hand and finger(s), subsequent encounter: Secondary | ICD-10-CM | POA: Diagnosis not present

## 2015-08-26 DIAGNOSIS — X58XXXD Exposure to other specified factors, subsequent encounter: Secondary | ICD-10-CM | POA: Insufficient documentation

## 2015-08-26 NOTE — Progress Notes (Signed)
PCP: Brooke BonitoGALLEMORE,WARREN, MD  Subjective:   HPI: Patient is a 55 y.o. female here for left wrist injury.  4/3: Patient reports on 3/31 she was wearing heels and fell backwards sustaining FOOSH injury to left wrist. Immediate pain, swelling, difficulty moving this wrist. Pain is improved, down to 3/10 but still sharp dorsally. No prior injuries. Pain improved with the splinting. She is right handed. No fevers, numbness, tingling.  4/10: Patient reports overall she's doing well. Pain still 3/10 and sharp dorsal wrist. Doing well in cast. No skin changes, numbness.  4/24: Patient reports she's doing very well. No pain currently. No swelling. No skin changes, numbness.  5/5: Patient is doing well Only with stiffness now. Tolerating cast without a problem. No pain currently. No skin changes, numbness.  Past Medical History  Diagnosis Date  . Murmur     Current Outpatient Prescriptions on File Prior to Visit  Medication Sig Dispense Refill  . citalopram (CELEXA) 20 MG tablet Take 20 mg by mouth daily.    Marland Kitchen. tinidazole (TINDAMAX) 500 MG tablet      No current facility-administered medications on file prior to visit.    Past Surgical History  Procedure Laterality Date  . Tubal ligation      Allergies  Allergen Reactions  . Penicillins     Social History   Social History  . Marital Status: Legally Separated    Spouse Name: N/A  . Number of Children: N/A  . Years of Education: N/A   Occupational History  . Not on file.   Social History Main Topics  . Smoking status: Former Games developermoker  . Smokeless tobacco: Never Used  . Alcohol Use: 0.0 oz/week    0 Standard drinks or equivalent per week     Comment: occassional  . Drug Use: No  . Sexual Activity: Not on file   Other Topics Concern  . Not on file   Social History Narrative    Family History  Problem Relation Age of Onset  . Atrial fibrillation Mother   . Heart failure Father   . Atrial fibrillation  Father     BP 114/73 mmHg  Pulse 76  Ht 5\' 5"  (1.651 m)  Wt 152 lb (68.947 kg)  BMI 25.29 kg/m2  Review of Systems: See HPI above.    Objective:  Physical Exam:  Gen: NAD, comfortable in exam room  Left wrist: Cast removed today.  No swelling, bruising. No TTP distal radius. Very limited wrist motion.  FROM digits and elbow without pain. Sensation intact to light touch. Cap refill < 2 sec.    Assessment & Plan:  1. Left wrist injury - independently reviewed radiographs - distal radius fracture that appears to have some extension to radiocarpal joint with radiographic and clinical healing.  Discontinue immobilization and start home exercises to regain motion.  Call us in 2 weeks for an update on her status.  Consider occupational therapy if not improving, f/u in office for reevaluation if a lot of problems (unlikely at this point).  Tylenol, nsaids if needed.

## 2015-08-26 NOTE — Assessment & Plan Note (Signed)
independently reviewed radiographs - distal radius fracture that appears to have some extension to radiocarpal joint with radiographic and clinical healing.  Discontinue immobilization and start home exercises to regain motion.  Call us in 2 weeks for an update on her status.  Consider occupational therapy if not improving, f/u in office for reevaluation if a lot of problems (unlikely at this point).  Tylenol, nsaids if needed.

## 2015-08-26 NOTE — Patient Instructions (Signed)
Start the easy motion exercises now I showed you to regain full motion. Call me in 2 weeks to let me know how you're doing. Can consider occupational therapy if you're having difficulty getting the motion back. If really hurting I'll want to see you back.

## 2016-07-16 IMAGING — DX DG WRIST COMPLETE 3+V*L*
4 series · 4 of 4 positions shown · non-contrast
Comparison: 07/23/2015

CLINICAL DATA: Distal radial fracture 2 weeks ago, with routine
healing, subsequent encounter

EXAM:
LEFT WRIST - COMPLETE 3+ VIEW

[wrist pa]
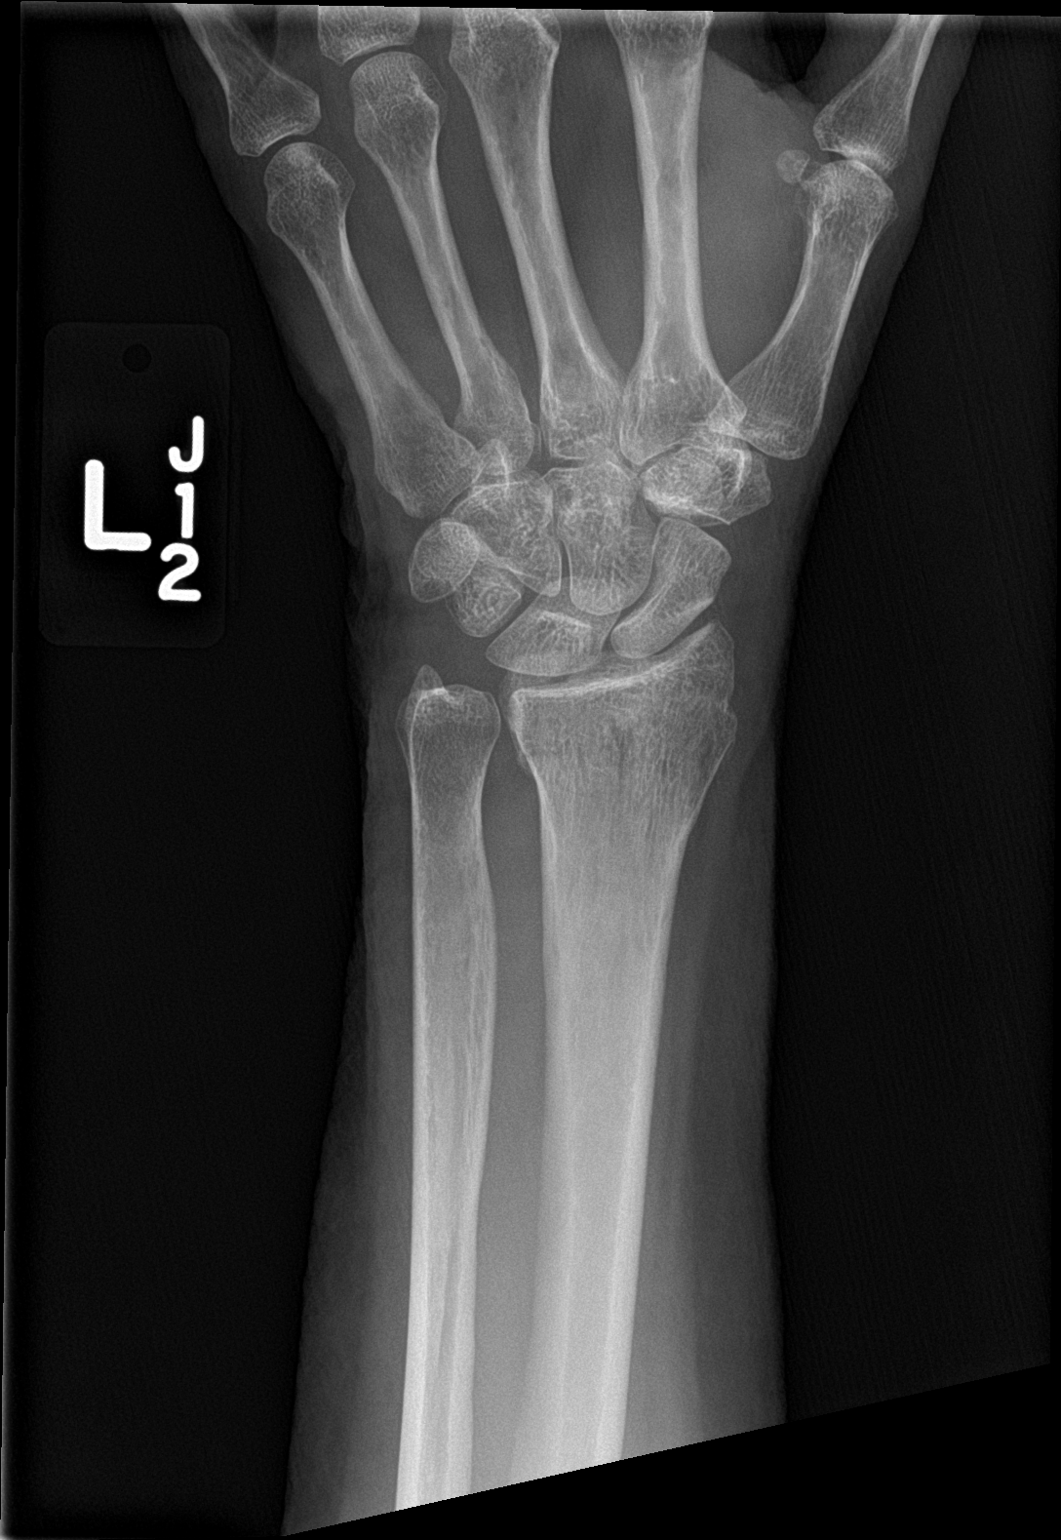

[wrist obl]
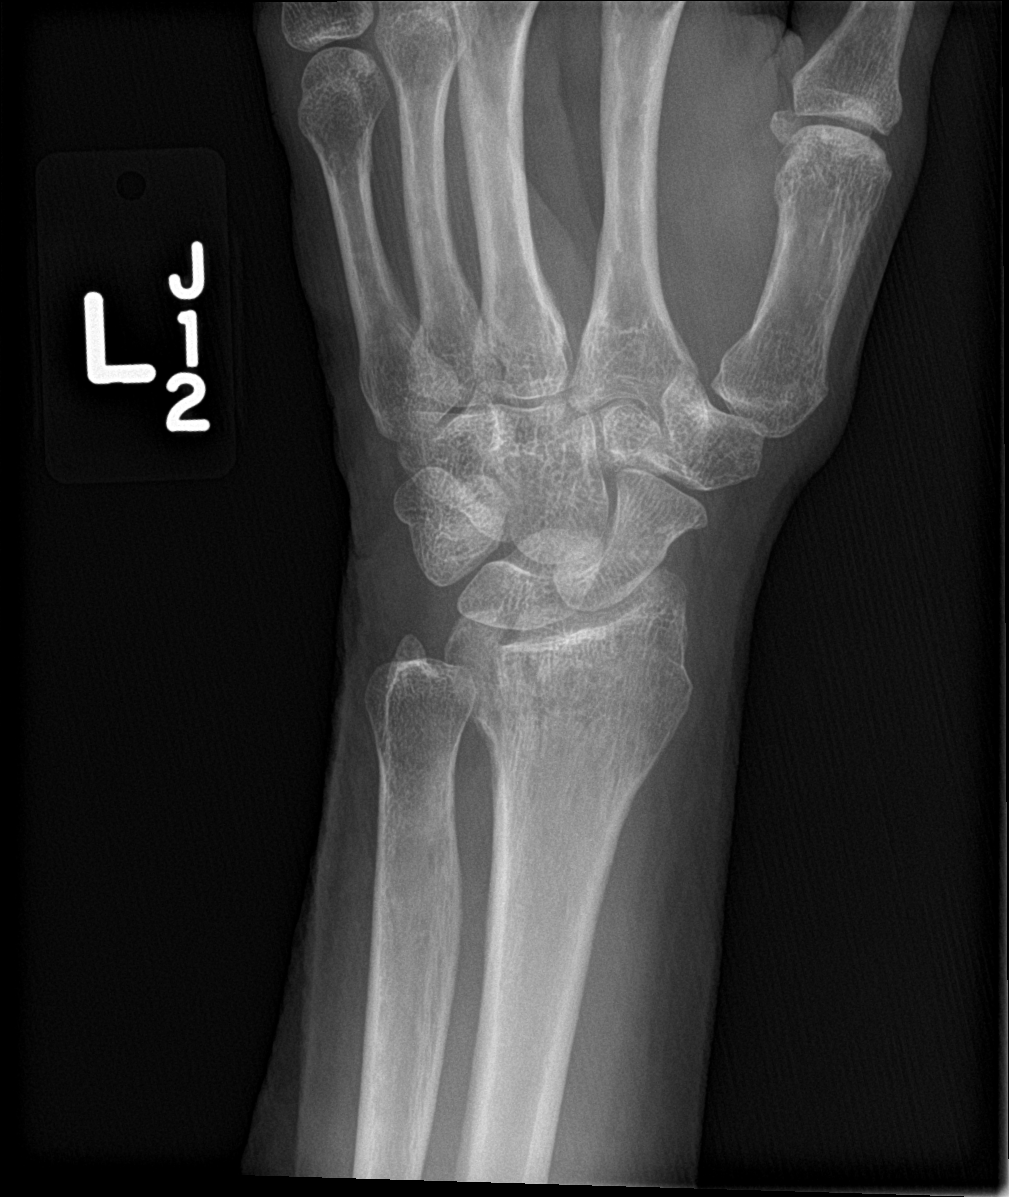

[wrist lat]
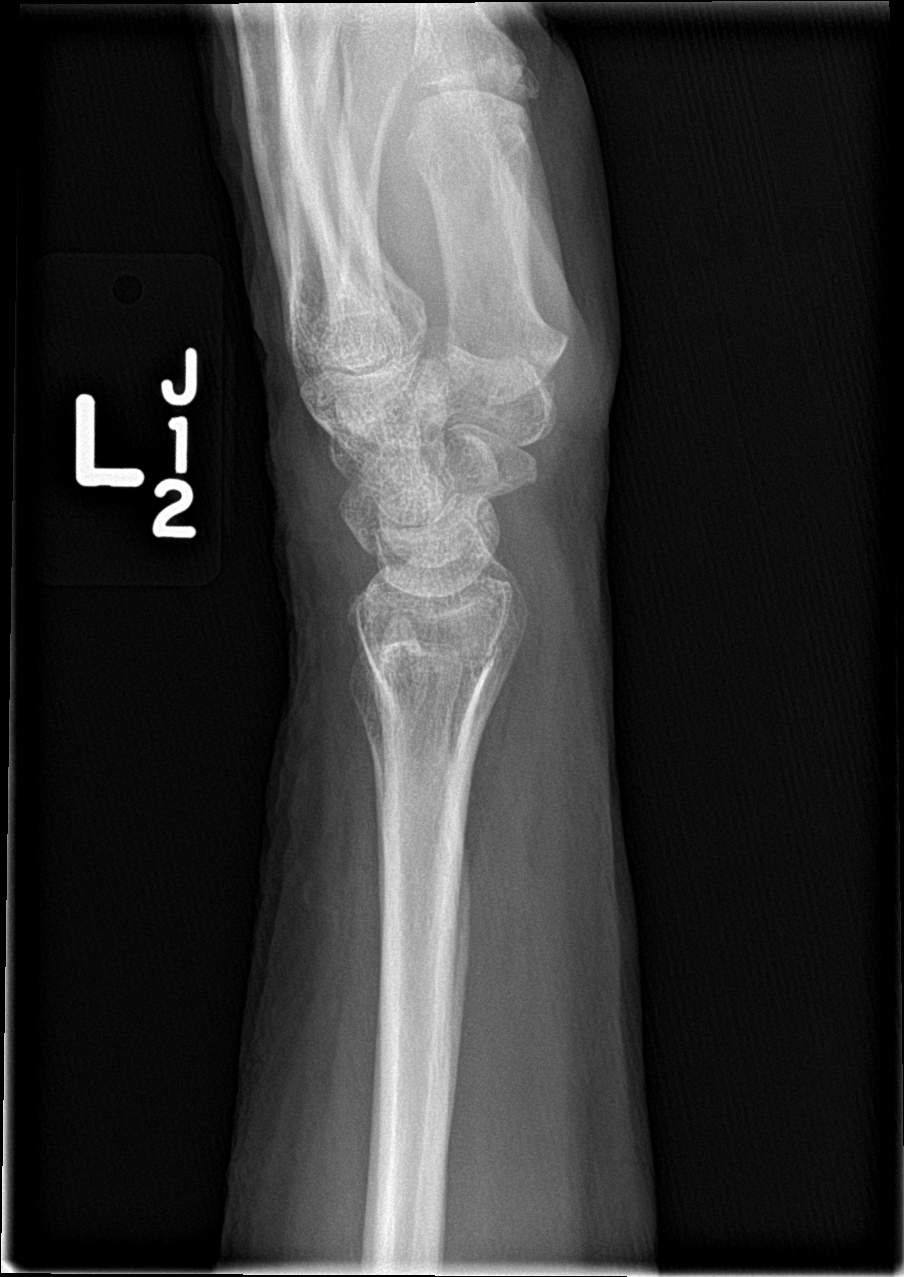

[wrist navicular]
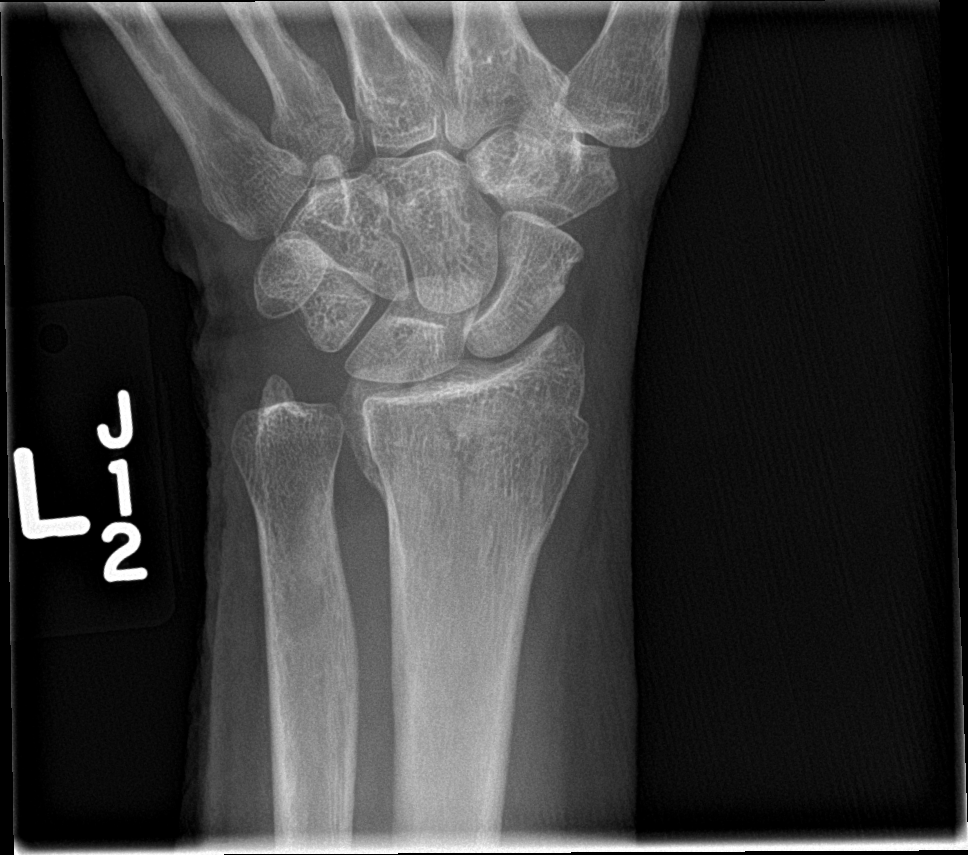

[4 of 4 positions shown; findings below may reference images not displayed]

FINDINGS: Minimally displaced intra-articular distal radial fracture,
unchanged. Fracture lucency remains visible.

No additional fracture is seen.

The joint spaces are preserved.

The visualized soft tissues are unremarkable.
IMPRESSION: Minimally displaced intra-articular distal radial fracture,
unchanged.

## 2016-08-07 ENCOUNTER — Telehealth: Payer: Self-pay | Admitting: Cardiovascular Disease

## 2016-08-07 NOTE — Telephone Encounter (Signed)
Returned the call to the patient. Kendra Huff stated that Kendra Huff recently had labs done and her cholesterol was elevated. Kendra Huff would like for Dr. Royann Shivers to look at them. Kendra Huff stated that Kendra Huff will fax them over.

## 2016-08-07 NOTE — Telephone Encounter (Signed)
New message    Pt is calling asking if she can fax over her lab results from her doctors appt on 07/31/16. She said some of her levels came back high and wants medication, crestor.

## 2016-08-17 ENCOUNTER — Telehealth: Payer: Self-pay | Admitting: Cardiovascular Disease

## 2016-08-17 DIAGNOSIS — E7800 Pure hypercholesterolemia, unspecified: Secondary | ICD-10-CM

## 2016-08-17 MED ORDER — ROSUVASTATIN CALCIUM 10 MG PO TABS: 10.0000 mg | ORAL_TABLET | Freq: Every day | ORAL | 3 refills | Status: DC

## 2016-08-17 NOTE — Telephone Encounter (Signed)
Notes recorded by Thurmon Fair, MD on 08/15/2016 at 5:16 PM EDT Please start Crestor 10 mg once daily and recheck lipids in 3 months. No need for CMET   Patient notified of MD recommendations & agrees w/plan.  Crestor ordered Lab slip mailed to patient.

## 2016-08-22 NOTE — Telephone Encounter (Signed)
See telephone encounter from 08/17/16.

## 2016-10-07 ENCOUNTER — Encounter: Payer: Self-pay | Admitting: Emergency Medicine

## 2016-10-07 ENCOUNTER — Emergency Department (INDEPENDENT_AMBULATORY_CARE_PROVIDER_SITE_OTHER)
Admission: EM | Admit: 2016-10-07 | Discharge: 2016-10-07 | Disposition: A | Discharge status: Home / Self Care | Payer: BLUE CROSS/BLUE SHIELD | Source: Home / Self Care | Attending: Family Medicine | Admitting: Family Medicine

## 2016-10-07 DIAGNOSIS — J069 Acute upper respiratory infection, unspecified: Secondary | ICD-10-CM

## 2016-10-07 DIAGNOSIS — B309 Viral conjunctivitis, unspecified: Secondary | ICD-10-CM

## 2016-10-07 MED ORDER — CARBOXYMETHYLCELLULOSE SODIUM 1 % OP SOLN: 1.0000 [drp] | Freq: Three times a day (TID) | OPHTHALMIC | 1 refills | Status: DC

## 2016-10-07 MED ORDER — BENZONATATE 100 MG PO CAPS: 100.0000 mg | ORAL_CAPSULE | Freq: Three times a day (TID) | ORAL | 0 refills | Status: DC

## 2016-10-07 MED ORDER — AZITHROMYCIN 250 MG PO TABS: 250.0000 mg | ORAL_TABLET | Freq: Every day | ORAL | 0 refills | Status: DC

## 2016-10-07 NOTE — ED Triage Notes (Signed)
Pt c/o congestion, productive cough, sinus pressure, eyes matted together, tried Nyquil x 4 days, seemed better but now feeling bad again.

## 2016-10-07 NOTE — ED Provider Notes (Signed)
CSN: [id number]     Arrival date & time 10/07/16  1457 History   First MD Initiated Contact with Patient 10/07/16 1514     Chief Complaint  Patient presents with  . URI   (Consider location/radiation/quality/duration/timing/severity/associated sxs/prior Treatment) HPI Kendra Huff is a 56 y.o. female presenting to UC with c/o 3 weeks of persistent, gradually worsening cough, congestion, sore throat, and now bilateral red watery eyes.  She notes a friend was sick recently and they both went on a cruise together.  She thinks she got even more sick when she got back from the cruise. She has taken Nyquil but no other OTC medications. No hx of asthma. Low grade fever initially that has since resolved. Denies n/v/d.    Past Medical History:  Diagnosis Date  . Murmur    Past Surgical History:  Procedure Laterality Date  . TUBAL LIGATION     Family History  Problem Relation Age of Onset  . Atrial fibrillation Mother   . Heart failure Father   . Atrial fibrillation Father    Social History  Substance Use Topics  . Smoking status: Former Games developermoker  . Smokeless tobacco: Never Used  . Alcohol use 0.0 oz/week     Comment: occassional   OB History    No data available     Review of Systems  Constitutional: Negative for chills and fever.  HENT: Positive for congestion and sore throat. Negative for ear pain, trouble swallowing and voice change.   Eyes: Positive for discharge (thin crusting in the morning), redness and itching.  Respiratory: Positive for cough. Negative for shortness of breath.   Cardiovascular: Negative for chest pain and palpitations.  Gastrointestinal: Negative for abdominal pain, diarrhea, nausea and vomiting.  Musculoskeletal: Negative for arthralgias, back pain and myalgias.  Skin: Negative for rash.    Allergies  Penicillins  Home Medications   Prior to Admission medications   Medication Sig Start Date End Date Taking? Authorizing Provider   Atorvastatin Calcium (LIPITOR PO) Take by mouth.   Yes [provider]  azithromycin (ZITHROMAX) 250 MG tablet Take 1 tablet (250 mg total) by mouth daily. Take first 2 tablets together, then 1 every day until finished. 10/07/16   Lurene ShadowPhelps, Margaretmary Prisk O, PA-C  benzonatate (TESSALON) 100 MG capsule Take 1-2 capsules (100-200 mg total) by mouth every 8 (eight) hours. 10/07/16   Lurene ShadowPhelps, Lillie Bollig O, PA-C  carboxymethylcellulose 1 % ophthalmic solution Apply 1 drop to eye 3 (three) times daily. 10/07/16   Lurene ShadowPhelps, Jahbari Repinski O, PA-C   Meds Ordered and Administered this Visit  Medications - No data to display  BP 101/63 (BP Location: Left Arm)   Pulse 65   Temp 98.8 F (37.1 C) (Oral)   Ht 5\' 5"  (1.651 m)   Wt 160 lb 4 oz (72.7 kg)   SpO2 98%   BMI 26.67 kg/m  No data found.   Physical Exam  Constitutional: She is oriented to person, place, and time. She appears well-developed and well-nourished. No distress.  HENT:  Head: Normocephalic and atraumatic.  Right Ear: Tympanic membrane normal.  Left Ear: Tympanic membrane normal.  Nose: Nose normal.  Mouth/Throat: Uvula is midline and mucous membranes are normal. Posterior oropharyngeal erythema present. No oropharyngeal exudate or posterior oropharyngeal edema.  Eyes: Lids are normal. Right eye exhibits discharge. Left eye exhibits discharge. Right conjunctiva is injected. Left conjunctiva is injected.  Bilateral eyes- mildly injected and watery  Neck: Normal range of motion.  Cardiovascular: Normal rate  and regular rhythm.   Pulmonary/Chest: Effort normal and breath sounds normal. No respiratory distress. She has no wheezes. She has no rales.  Musculoskeletal: Normal range of motion.  Neurological: She is alert and oriented to person, place, and time.  Skin: Skin is warm and dry. She is not diaphoretic.  Psychiatric: She has a normal mood and affect. Her behavior is normal.  Nursing note and vitals reviewed.   Urgent Care Course      Procedures (including critical care time)  Labs Review Labs Reviewed - No data to display  Imaging Review No results found.    MDM   1. Upper respiratory tract infection, unspecified type   2. Acute viral conjunctivitis of both eyes    URI symptoms gradually worsening, going on 3 weeks.  Will cover for atypical bacteria. Eye exam not concerning for bacterial conjunctivitis. Encouraged symptomatic treatment of eyes.  Rx: Azithromycin, tessalon, and refresh eye drops  Home care instructions provided F/u with PCP in 1 week if not improving, sooner if worsening.     Lurene Shadow, [place] 10/07/16 [phone]

## 2017-10-28 ENCOUNTER — Other Ambulatory Visit: Payer: Self-pay | Admitting: Cardiovascular Disease

## 2017-10-28 DIAGNOSIS — E7800 Pure hypercholesterolemia, unspecified: Secondary | ICD-10-CM

## 2018-04-29 ENCOUNTER — Other Ambulatory Visit: Payer: Self-pay | Admitting: Cardiovascular Disease

## 2018-04-29 DIAGNOSIS — E7800 Pure hypercholesterolemia, unspecified: Secondary | ICD-10-CM

## 2018-05-01 ENCOUNTER — Other Ambulatory Visit: Payer: Self-pay | Admitting: Cardiovascular Disease

## 2018-05-01 DIAGNOSIS — E7800 Pure hypercholesterolemia, unspecified: Secondary | ICD-10-CM

## 2018-08-19 ENCOUNTER — Telehealth: Payer: Self-pay | Admitting: Cardiovascular Disease

## 2018-08-19 NOTE — Telephone Encounter (Signed)
Patient stated that all she needs is a refill on cholesterol and may not need appt. She called Triage

## 2018-08-21 ENCOUNTER — Telehealth (INDEPENDENT_AMBULATORY_CARE_PROVIDER_SITE_OTHER): Payer: BLUE CROSS/BLUE SHIELD | Admitting: Cardiovascular Disease

## 2018-08-21 ENCOUNTER — Encounter: Payer: Self-pay | Admitting: Cardiovascular Disease

## 2018-08-21 VITALS — Ht 65.0 in | Wt 163.0 lb

## 2018-08-21 DIAGNOSIS — R001 Bradycardia, unspecified: Secondary | ICD-10-CM

## 2018-08-21 DIAGNOSIS — E7800 Pure hypercholesterolemia, unspecified: Secondary | ICD-10-CM

## 2018-08-21 DIAGNOSIS — R002 Palpitations: Secondary | ICD-10-CM

## 2018-08-21 MED ORDER — ROSUVASTATIN CALCIUM 10 MG PO TABS: 10.0000 mg | ORAL_TABLET | Freq: Every day | ORAL | 3 refills | Status: DC

## 2018-08-21 NOTE — Progress Notes (Signed)
Virtual Visit via Video Note   This visit type was conducted due to national recommendations for restrictions regarding the COVID-19 Pandemic (e.g. social distancing) in an effort to limit this patient's exposure and mitigate transmission in our community.  Due to her co-morbid illnesses, this patient is at least at moderate risk for complications without adequate follow up.  This format is felt to be most appropriate for this patient at this time.  All issues noted in this document were discussed and addressed.  A limited physical exam was performed with this format.  Please refer to the patient's chart for her consent to telehealth for [facility].   Evaluation Performed:  Follow-up visit  Date:  08/21/2018   ID:  Kendra CobbCynthia L Huff, DOB 1960-04-30, MRN [id number]  Patient Location: Home Provider Location: Home  PCP:  Brooke BonitoGallemore, Warren, MD  Cardiologist:  Rinda Rollyson Electrophysiologist:  None   Chief Complaint:  Hypercholesterolemia  History of Present Illness:    Kendra PrinceCynthia L Huff is a 58 y.o. female with severely elevated LDL cholesterol but without known coronary or vascular disease.  She has no cardiac complaints.  She had a normal nuclear stress test in 2010 and normal left ventricular systolic and diastolic function by echo in 2014.  The patient specifically denies any chest pain at rest exertion, dyspnea at rest or with exertion, orthopnea, paroxysmal nocturnal dyspnea, syncope, palpitations, focal neurological deficits, intermittent claudication, lower extremity edema, unexplained weight gain, cough, hemoptysis or wheezing.  The patient does not have symptoms concerning for COVID-19 infection (fever, chills, cough, or new shortness of breath).    Past Medical History:  Diagnosis Date  . Murmur    Past Surgical History:  Procedure Laterality Date  . TUBAL LIGATION       Current Meds  Medication Sig  . citalopram (CELEXA) 20 MG tablet Take 1 tablet by mouth daily.   . progesterone (PROMETRIUM) 100 MG capsule Take 100 mg by mouth daily.  . rosuvastatin (CRESTOR) 10 MG tablet Take 10 mg by mouth daily.  Marland Kitchen. thyroid (ARMOUR) 32.5 MG tablet Take 32.5 mg by mouth daily.     Allergies:   Penicillins   Social History   Tobacco Use  . Smoking status: Former Games developermoker  . Smokeless tobacco: Never Used  Substance Use Topics  . Alcohol use: Yes    Alcohol/week: 0.0 standard drinks    Comment: occassional  . Drug use: No     Family Hx: The patient's family history includes Atrial fibrillation in her father and mother; Heart failure in her father.  ROS:   Please see the history of present illness.     All other systems reviewed and are negative.   Prior CV studies:   The following studies were reviewed today:  Labs/Other Tests and Data Reviewed:    EKG:  No ECG reviewed.  Recent Labs: No results found for requested labs within last 8760 hours.   Recent Lipid Panel Lab Results  Component Value Date/Time   CHOL 261 (H) 05/03/2014 08:36 AM   TRIG 90 05/03/2014 08:36 AM   HDL 59 05/03/2014 08:36 AM   CHOLHDL 4.4 05/03/2014 08:36 AM   LDLCALC 184 (H) 05/03/2014 08:36 AM   Labs from care everywhere May 2019 show total cholesterol 244, HDL 71, LDL 168, triglycerides 80  Wt Readings from Last 3 Encounters:  08/21/18 163 lb (73.9 kg)  10/07/16 160 lb 4 oz (72.7 kg)  08/26/15 152 lb (68.9 kg)     Objective:  Vital Signs:  Ht 5\' 5"  (1.651 m)   Wt 163 lb (73.9 kg)   BMI 27.12 kg/m    VITAL SIGNS:  reviewed GEN:  no acute distress EYES:  sclerae anicteric, EOMI - Extraocular Movements Intact RESPIRATORY:  normal respiratory effort, symmetric expansion CARDIOVASCULAR:  no peripheral edema SKIN:  no rash, lesions or ulcers. MUSCULOSKELETAL:  no obvious deformities. NEURO:  alert and oriented x 3, no obvious focal deficit PSYCH:  normal affect Appears lean and fit  ASSESSMENT & PLAN:    1. HLP: Despite a healthy lifestyle and lean body  habitus she has significant hypercholesterolemia and a markedly elevated LDL cholesterol.  Is not clear to me whether she was taking her rosuvastatin the last performed roughly a year ago, but the total cholesterol and LDL levels were very similar to the untreated levels.  We will recheck a lipid profile in 3 months,  COVID-19 Education: The signs and symptoms of COVID-19 were discussed with the patient and how to seek care for testing (follow up with PCP or arrange E-visit).  The importance of social distancing was discussed today.  Time:   Today, I have spent 14 minutes with the patient with telehealth technology discussing the above problems.     Medication Adjustments/Labs and Tests Ordered: Current medicines are reviewed at length with the patient today.  Concerns regarding medicines are outlined above.   Tests Ordered: No orders of the defined types were placed in this encounter.   Medication Changes: No orders of the defined types were placed in this encounter.   Disposition:  Follow up 12 months  Signed, Thurmon Fair, MD  08/21/2018 8:59 AM    [facility]

## 2018-08-21 NOTE — Patient Instructions (Signed)
Medication Instructions:  Continue same medications If you need a refill on your cardiac medications before your next appointment, please call your pharmacy.   Lab work: Personnel officer and Lipid Panel in 3 months 12/19/18 at our office [facility]  No appointment needed Lab Opens 8:00 am to 12:00 noon  Nothing to eat or drink after midnight  If you have labs (blood work) drawn today and your tests are completely normal, you will receive your results only by: Marland Kitchen MyChart Message (if you have MyChart) OR . A paper copy in the mail If you have any lab test that is abnormal or we need to change your treatment, we will call you to review the results.  Testing/Procedures: None ordered  Follow-Up: At [facility], you and your health needs are our priority.  As part of our continuing mission to provide you with exceptional heart care, we have created designated Provider Care Teams.  These Care Teams include your primary Cardiologist (physician) and Advanced Practice Providers (APPs -  Physician Assistants and Nurse Practitioners) who all work together to provide you with the care you need, when you need it. . Schedule follow up appointment with Dr.Croitoru in 12 months    Call 3 months before to schedule

## 2018-11-11 ENCOUNTER — Telehealth: Payer: Self-pay | Admitting: Cardiovascular Disease

## 2018-11-11 NOTE — Telephone Encounter (Signed)
Received call from patient she stated her personal trainer needs a letter from Dr.Croitoru saying ok to exercise during the Covid 19 pandemic.Advised I will send message to Dr.Croitoru.

## 2018-11-11 NOTE — Telephone Encounter (Signed)
New Message           Patient is calling in today to get a letter stating that she may continue to exercise during the COVID-19 pandemic. Patient has a Physiological scientist and they are staying 19ft apart. Is this ok? Personal trainer is needing this for her records. Please call to get specifics.. Thank you.

## 2018-11-11 NOTE — Telephone Encounter (Signed)
Left message to call back  

## 2018-11-17 NOTE — Telephone Encounter (Signed)
Follow up ° ° °Patient is returning your call. Please call. ° ° ° °

## 2018-11-17 NOTE — Telephone Encounter (Signed)
Left a message for the patient to call back.  

## 2018-11-17 NOTE — Telephone Encounter (Signed)
°  Patient called the office in regards to her letter clearing her to exercise. She has not received anything yet and wants to make sure the office did not forget.

## 2018-11-17 NOTE — Telephone Encounter (Signed)
Returned the call to the patient. She hired a Physiological scientist and the Clinical research associate needs a letter stating that it is okay for her to workout from a cardiac stnadpoint.  The letter can be faxed to [phone] attention to the patient.

## 2018-11-18 NOTE — Telephone Encounter (Signed)
Yes it is. Can you get them the letter, please. Ma'am.

## 2018-11-20 ENCOUNTER — Encounter: Payer: Self-pay | Admitting: *Deleted

## 2018-11-27 ENCOUNTER — Telehealth: Payer: Self-pay | Admitting: Cardiovascular Disease

## 2018-11-27 NOTE — Telephone Encounter (Signed)
New Message    Pt had a letter written so she can work out but she says she needs a new one with more information    Please call

## 2018-11-27 NOTE — Telephone Encounter (Signed)
Please advise for more updated and more informational letter?  Thanks!

## 2018-12-01 ENCOUNTER — Encounter: Payer: Self-pay | Admitting: *Deleted

## 2018-12-01 NOTE — Telephone Encounter (Signed)
The letter has been faxed.

## 2018-12-01 NOTE — Telephone Encounter (Signed)
Yes OK

## 2018-12-01 NOTE — Telephone Encounter (Signed)
The patient stated that she needs a letter to workout that states the following:  "I recommend Jenny Reichmann to work out under the guidance of fitness professional for health reasons."  Letter can be faxed to [phone]

## 2019-09-07 ENCOUNTER — Other Ambulatory Visit: Payer: Self-pay | Admitting: Cardiovascular Disease

## 2019-09-22 ENCOUNTER — Other Ambulatory Visit: Payer: Self-pay | Admitting: Cardiovascular Disease

## 2019-11-02 ENCOUNTER — Other Ambulatory Visit: Payer: Self-pay

## 2019-11-02 ENCOUNTER — Encounter (HOSPITAL_BASED_OUTPATIENT_CLINIC_OR_DEPARTMENT_OTHER): Payer: Self-pay | Admitting: Emergency Medicine

## 2019-11-02 ENCOUNTER — Emergency Department (HOSPITAL_BASED_OUTPATIENT_CLINIC_OR_DEPARTMENT_OTHER)
Admission: EM | Admit: 2019-11-02 | Discharge: 2019-11-02 | Disposition: A | Discharge status: Home / Self Care | Payer: BC Managed Care – PPO | Attending: Emergency Medicine | Admitting: Emergency Medicine

## 2019-11-02 DIAGNOSIS — Z87891 Personal history of nicotine dependence: Secondary | ICD-10-CM | POA: Diagnosis not present

## 2019-11-02 DIAGNOSIS — R6884 Jaw pain: Secondary | ICD-10-CM | POA: Insufficient documentation

## 2019-11-02 DIAGNOSIS — M26622 Arthralgia of left temporomandibular joint: Secondary | ICD-10-CM

## 2019-11-02 DIAGNOSIS — R202 Paresthesia of skin: Secondary | ICD-10-CM | POA: Diagnosis not present

## 2019-11-02 DIAGNOSIS — K0889 Other specified disorders of teeth and supporting structures: Secondary | ICD-10-CM

## 2019-11-02 LAB — BASIC METABOLIC PANEL
Anion gap: 13 (ref 5–15)
BUN: 21 mg/dL — ABNORMAL HIGH (ref 6–20)
CO2: 26 mmol/L (ref 22–32)
Calcium: 9.2 mg/dL (ref 8.9–10.3)
Chloride: 102 mmol/L (ref 98–111)
Creatinine, Ser: 0.89 mg/dL (ref 0.44–1.00)
GFR calc Af Amer: >60 mL/min (ref >60)
GFR calc non Af Amer: >60 mL/min (ref >60)
Glucose, Bld: 94 mg/dL (ref 70–99)
Potassium: 3.8 mmol/L (ref 3.5–5.1)
Sodium: 141 mmol/L (ref 135–145)

## 2019-11-02 LAB — CBC WITH DIFFERENTIAL/PLATELET
Abs Immature Granulocytes: 0.05 10*3/uL (ref 0.00–0.07)
Basophils Absolute: 0.1 10*3/uL (ref 0.0–0.1)
Basophils Relative: 1 %
Eosinophils Absolute: 0.1 10*3/uL (ref 0.0–0.5)
Eosinophils Relative: 1 %
HCT: 39.2 % (ref 36.0–46.0)
Hemoglobin: 12.8 g/dL (ref 12.0–15.0)
Immature Granulocytes: 1 %
Lymphocytes Relative: 30 %
Lymphs Abs: 2.5 10*3/uL (ref 0.7–4.0)
MCH: 30.5 pg (ref 26.0–34.0)
MCHC: 32.7 g/dL (ref 30.0–36.0)
MCV: 93.3 fL (ref 80.0–100.0)
Monocytes Absolute: 0.7 10*3/uL (ref 0.1–1.0)
Monocytes Relative: 8 %
Neutro Abs: 4.9 10*3/uL (ref 1.7–7.7)
Neutrophils Relative %: 59 %
Platelets: 320 10*3/uL (ref 150–400)
RBC: 4.2 MIL/uL (ref 3.87–5.11)
RDW: 13.2 % (ref 11.5–15.5)
WBC: 8.3 10*3/uL (ref 4.0–10.5)
nRBC: 0 % (ref 0.0–0.2)

## 2019-11-02 LAB — TROPONIN I (HIGH SENSITIVITY): Troponin I (High Sensitivity): 5 ng/L (ref <18)

## 2019-11-02 MED ORDER — CLINDAMYCIN HCL 300 MG PO CAPS: 300.0000 mg | ORAL_CAPSULE | Freq: Three times a day (TID) | ORAL | 0 refills | Status: AC

## 2019-11-02 MED ORDER — CLINDAMYCIN HCL 150 MG PO CAPS
300.0000 mg | ORAL_CAPSULE | Freq: Once | ORAL | Status: AC
Start: 2019-11-02 — End: 2019-11-02
  Administered 2019-11-02: 300 mg via ORAL
  Filled 2019-11-02: qty 2

## 2019-11-02 NOTE — ED Provider Notes (Signed)
[facility] EMERGENCY DEPARTMENT Provider Note   CSN: [id number] Arrival date & time: 11/02/19  1343     History Chief Complaint  Patient presents with  . Numbness  . Jaw Pain    Kendra Huff is a 59 y.o. female who presented with left jaw pain.  Patient recently had conjunctivitis and is still on eyedrops. Patient states that since yesterday, she noticed some pain in the left jaw.  She states that it is worse when she opens her mouth.  She also has a numbness in her tongue as well.  Denies any chest pain or shortness of breath.  She went to urgent care was sent in to rule out a heart attack.  She has no heart history in the past.  Denies any stents in her heart.  The history is provided by the patient.       Past Medical History:  Diagnosis Date  . Murmur     Patient Active Problem List   Diagnosis Date Noted  . Left wrist injury 07/26/2015  . Sinus bradycardia 05/03/2014  . Palpitations 05/03/2014  . Hypercholesterolemia 05/03/2014    Past Surgical History:  Procedure Laterality Date  . TUBAL LIGATION       OB History   No obstetric history on file.     Family History  Problem Relation Age of Onset  . Atrial fibrillation Mother   . Heart failure Father   . Atrial fibrillation Father     Social History   Tobacco Use  . Smoking status: Former Games developer  . Smokeless tobacco: Never Used  Substance Use Topics  . Alcohol use: Yes    Alcohol/week: 0.0 standard drinks    Comment: occassional  . Drug use: No    Home Medications Prior to Admission medications   Medication Sig Start Date End Date Taking? Authorizing Provider  citalopram (CELEXA) 20 MG tablet Take 1 tablet by mouth daily. 09/04/17   [provider]  progesterone (PROMETRIUM) 100 MG capsule Take 100 mg by mouth daily.    [provider]  rosuvastatin (CRESTOR) 10 MG tablet TAKE 1 TABLET BY MOUTH EVERY DAY 09/08/19   Croitoru, Mihai, MD  thyroid (ARMOUR) 32.5 MG  tablet Take 32.5 mg by mouth daily.    [provider]    Allergies    Penicillins  Review of Systems   Review of Systems  HENT:       Jaw pain   All other systems reviewed and are negative.   Physical Exam Updated Vital Signs BP (!) 150/97 (BP Location: Right Arm)   Pulse (!) 53   Temp 98.6 F (37 C) (Oral)   Resp 19   Ht 5\' 5"  (1.651 m)   Wt 65.8 kg   SpO2 100%   BMI 24.13 kg/m   Physical Exam Vitals and nursing note reviewed.  Constitutional:      Appearance: Normal appearance.  HENT:     Head: Normocephalic.     Right Ear: Tympanic membrane normal.     Left Ear: Tympanic membrane normal.     Nose: Nose normal.     Mouth/Throat:     Mouth: Mucous membranes are moist.     Comments: Dentition overall.  Patient had a dental implant in the left lower molar.  Mild tenderness over the left TMJ.  No obvious periapical abscess.  Eyes:     Extraocular Movements: Extraocular movements intact.     Pupils: Pupils are equal, round, and reactive  to light.  Neck:     Comments: No cervical lymphadenopathy. Cardiovascular:     Rate and Rhythm: Normal rate and regular rhythm.     Pulses: Normal pulses.     Heart sounds: Normal heart sounds.  Pulmonary:     Effort: Pulmonary effort is normal.     Breath sounds: Normal breath sounds.  Abdominal:     General: Abdomen is flat.     Palpations: Abdomen is soft.  Musculoskeletal:        General: Normal range of motion.     Cervical back: Normal range of motion.  Skin:    General: Skin is warm.     Capillary Refill: Capillary refill takes less than 2 seconds.  Neurological:     General: No focal deficit present.     Mental Status: She is alert and oriented to person, place, and time.  Psychiatric:        Mood and Affect: Mood normal.        Behavior: Behavior normal.     ED Results / Procedures / Treatments   Labs (all labs ordered are listed, but only abnormal results are displayed) Labs Reviewed  BASIC  METABOLIC PANEL - Abnormal; Notable for the following components:      Result Value   BUN 21 (*)    All other components within normal limits  CBC WITH DIFFERENTIAL/PLATELET  TROPONIN I (HIGH SENSITIVITY)    EKG EKG Interpretation  Date/Time:  Monday November 02 2019 14:19:59 EDT Ventricular Rate:  55 PR Interval:  162 QRS Duration: 84 QT Interval:  444 QTC Calculation: 424 R Axis:   68 Text Interpretation: Sinus bradycardia Lateral infarct , age undetermined Abnormal ECG No previous ECGs available Confirmed by Richardean Canal [phone]) on 11/02/2019 6:53:33 PM   Radiology No results found.  Procedures Procedures (including critical care time)  Medications Ordered in ED Medications  clindamycin (CLEOCIN) capsule 300 mg (300 mg Oral Given 11/02/19 1954)    ED Course  I have reviewed the triage vital signs and the nursing notes.  Pertinent labs & imaging results that were available during my care of the patient were reviewed by me and considered in my medical decision making (see chart for details).    MDM Rules/Calculators/A&P                          AMBRI MILTNER is a 59 y.o. female who presented with left jaw pain. I think likely TMJ pain versus early tooth infection. No obvious tongue swelling or periapical abscess or Ludwig angina.  It can certainly be ACS but my suspicion is very low.  Symptoms for 24 hours so 1 set of troponin sufficient.  8:39 PM Trop neg x 1. WBC nl. Will dc home with clindamycin. Will have her follow up with dentist and take motrin for pain   Final Clinical Impression(s) / ED Diagnoses Final diagnoses:  None    Rx / DC Orders ED Discharge Orders    None       Charlynne Pander, MD 11/02/19 2039

## 2019-11-02 NOTE — ED Triage Notes (Signed)
L side facial numbness and L jaw pain since last night. Recently treated for pink eye and sinus infection.

## 2019-11-02 NOTE — Discharge Instructions (Signed)
You likely have TMJ pain. Take motrin for pain   You may have a dental infection. Take clindamycin as prescribed   Your heart function is normal today   See your dentist for follow up  Return to ER if you have worse jaw pain, dental pain, fever, trouble swallowing

## 2023-06-12 ENCOUNTER — Ambulatory Visit: Payer: BC Managed Care – PPO | Admitting: Cardiology

## 2023-09-02 ENCOUNTER — Ambulatory Visit: Payer: BC Managed Care – PPO | Attending: Cardiovascular Disease | Admitting: Cardiovascular Disease

## 2023-09-03 ENCOUNTER — Encounter: Payer: Self-pay | Admitting: Cardiovascular Disease

## 2023-12-24 ENCOUNTER — Ambulatory Visit: Admitting: Cardiovascular Disease

## 2024-02-25 ENCOUNTER — Ambulatory Visit: Admitting: Cardiovascular Disease

## 2024-03-11 ENCOUNTER — Ambulatory Visit: Admitting: Cardiovascular Disease

## 2024-04-21 ENCOUNTER — Ambulatory Visit: Admitting: Cardiovascular Disease
